# Patient Record
Sex: Female | Born: 1940 | Race: White | Hispanic: No
Health system: Midwestern US, Academic
[De-identification: ages and names within clinical notes are randomized; demographics above are authoritative.]

---

## 2018-12-21 ENCOUNTER — Encounter: Admit: 2018-12-21 | Discharge: 2018-12-22 | Payer: Medicare Other

## 2018-12-21 DIAGNOSIS — R69 Illness, unspecified: Secondary | ICD-10-CM

## 2019-01-11 ENCOUNTER — Encounter: Admit: 2019-01-11 | Discharge: 2019-01-11 | Payer: MEDICARE

## 2019-01-11 DIAGNOSIS — R69 Illness, unspecified: Principal | ICD-10-CM

## 2019-05-28 ENCOUNTER — Encounter: Admit: 2019-05-28 | Discharge: 2019-05-28

## 2019-05-28 DIAGNOSIS — M48061 Spinal stenosis, lumbar region without neurogenic claudication: Secondary | ICD-10-CM

## 2019-06-04 ENCOUNTER — Encounter: Admit: 2019-06-04 | Discharge: 2019-06-04

## 2019-06-04 NOTE — Telephone Encounter
Patient called asking if daughter would be allowed to come to clinic visit.  Informed patient we allow one visitor per patient during clinic visit and daughter would be able to come to appt.

## 2019-06-05 ENCOUNTER — Ambulatory Visit: Admit: 2019-06-05 | Discharge: 2019-06-05

## 2019-06-05 ENCOUNTER — Encounter: Admit: 2019-06-05 | Discharge: 2019-06-05

## 2019-06-05 DIAGNOSIS — F17200 Nicotine dependence, unspecified, uncomplicated: Secondary | ICD-10-CM

## 2019-06-05 DIAGNOSIS — R292 Abnormal reflex: Secondary | ICD-10-CM

## 2019-06-05 DIAGNOSIS — M48062 Spinal stenosis, lumbar region with neurogenic claudication: Secondary | ICD-10-CM

## 2019-06-05 DIAGNOSIS — R32 Unspecified urinary incontinence: Secondary | ICD-10-CM

## 2019-06-05 DIAGNOSIS — M48061 Spinal stenosis, lumbar region without neurogenic claudication: Principal | ICD-10-CM

## 2019-06-05 DIAGNOSIS — I739 Peripheral vascular disease, unspecified: Secondary | ICD-10-CM

## 2019-06-05 DIAGNOSIS — M5416 Radiculopathy, lumbar region: Secondary | ICD-10-CM

## 2019-06-05 DIAGNOSIS — Z8659 Personal history of other mental and behavioral disorders: Secondary | ICD-10-CM

## 2019-06-05 NOTE — Patient Instructions
It was a pleasure seeing you in clinic today.  You will be receiving a survey about today's clinic visit.  We encourage you to complete the survey.  Your opinion matters to us.  We need your feedback to help us continue to build on our strengths as well as look at areas for improvement.  Please don't hesitate to call if you have any questions.      Davaris Youtsey BSN, RN, CNOR  Clinical Nurse Coordinator  Dr. Brandon Carlson  [facility]  [facility]  [address]  [place], [place] [address]  [email or web address]  Phone: [phone]  Scheduling [phone]

## 2019-06-05 NOTE — Progress Notes
[facility]  Spinal Surgery Consultation      CHIEF COMPLAINT     Chief Complaint   Patient presents with   ??? Lower Back - Pain   ??? Left Leg - Pain   ??? Right Leg - Pain       HISTORY OF PRESENT ILLNESS      Stacy Mora is a 78 y.o. female.  She presents for evaluation of back, buttock, and bilateral leg pain, right worse than left.  Been going on for eight years.  Mostly in the low back and legs, right more than left.  She had an MRI performed which showed spinal stenosis and herniated disc degeneration.  She has this pain all the time and it gets worse with activity.  She describes her pain as aching, stabbing, tiring, burning, sharp, and unbearable.  Rest and bending over seem to help it.  Activity makes it worse.  She can stand or walk for about 5 minutes at a time.  She is able to walk further leaning over a shopping cart.  She gets pain that radiates down her legs to above her ankles, both sides.  No numbness or tingling.  No bladder dysfunction, but does endorse bowel incontinence.  No significant night pain.  Back pain rated as 4/10.  Leg pain 4/10.  No history of scoliosis.  She had x-ray and MRI performed at an outside institution.  She has tried physical therapy with no relief at outside institution.  She had chiropractic care done with some relief.  She had an injection performed which gave her relief for three days.  She has taken Ibuprofen which gives her some relief.  She smokes one pack per day and has done so for 60 years.      NSAIDS:  Yes  PT:  Yes  Pain medications:  Yes  Chiropractic:  Yes  Activity modification: Yes  Injections:  Yes    Previous Spine Surgery: No     PAST MEDICAL HISTORY     Medical History:   Diagnosis Date   ??? H/O: depression    ??? Incontinence    ??? Peripheral vascular disease, unspecified (HCC)     Peripheral vascular disease       PAST SURGICAL HISTORY     Surgical History:   Procedure Laterality Date   ??? HX BLADDER SUSPENSION ??? HX CARPAL TUNNEL RELEASE     ??? HX CHOLECYSTECTOMY     ??? HX KNEE SURGERY         FAMILY HISTORY   family history includes Cancer in her father; Diabetes in her mother; Heart problem in her mother.    SOCIAL HISTORY     Social History     Socioeconomic History   ??? Marital status: Widowed     Spouse name: Not on file   ??? Number of children: Not on file   ??? Years of education: Not on file   ??? Highest education level: Not on file   Occupational History   ??? Not on file   Tobacco Use   ??? Smoking status: Current Every Day Smoker     Packs/day: 1.00     Years: 50.00     Pack years: 50.00     Types: Cigarettes   ??? Smokeless tobacco: Never Used   Substance and Sexual Activity   ??? Alcohol use: Yes     Alcohol/week: 2.5 standard drinks     Types: 3 Standard  drinks or equivalent per week     Frequency: 4 or more times a week   ??? Drug use: No   ??? Sexual activity: Not on file   Other Topics Concern   ??? Not on file   Social History Narrative   ??? Not on file       ALLERGIES     Allergies   Allergen Reactions   ??? Levofloxacin      Allergy recorded in SMS: LEVAQUIN~Reactions: HIVES/STIFFNESS   ??? Penicillins      Allergy recorded in SMS: PCN~Reactions: HIVES~HIVES/INJ.SITE       MEDICATIONS     Current Outpatient Medications:   ???  buPROPion XL (WELLBUTRIN XL) 150 mg tablet, Take 150 mg by mouth every morning. Do not crush or chew., Disp: , Rfl:   ???  citalopram (CELEXA) 40 mg tablet, Take 40 mg by mouth daily., Disp: , Rfl:   ???  levothyroxine (SYNTHROID) 125 mcg tablet, Take 125 mcg by mouth daily 30 minutes before breakfast., Disp: , Rfl:   ???  LEXAPRO PO, Take by mouth. , Disp: , Rfl:   ???  losartan (COZAAR) 50 mg tablet, Take 50 mg by mouth daily., Disp: , Rfl:   ???  rosuvastatin (CRESTOR) 10 mg tablet, Take 10 mg by mouth daily., Disp: , Rfl:   ???  SYNTHROID PO, Take by mouth. , Disp: , Rfl:   ???  VYTORIN 10/10 10-10 mg Tab, Take 1 Tab by mouth At Bedtime Daily. , Disp: , Rfl:     REVIEW OF SYSTEMS   Review of Systems Constitutional: Positive for activity change.   HENT: Positive for hearing loss.    Respiratory: Positive for cough.    Gastrointestinal: Positive for rectal pain.   Musculoskeletal: Positive for back pain.   Skin: Positive for color change.   All other systems reviewed and are negative.    A 10-point ROS was otherwise negative.  PHYSICAL EXAM   Blood pressure 122/53, pulse 64, resp. rate 18, height 160 cm (63), weight 81.6 kg (180 lb), SpO2 100 %.  Body mass index is 31.89 kg/m???.       Pain Score: Three    Constitutional: Alert, NAD  Psychiatric: Mood and affect appropriate  Eyes: EOMI  Respiratory: Unlabored respirations  Cardiovascular: Palpable radial and pedal pulses distally.  Skin: No rashes or lesions  Musculoskeletal:  Spine Exam:    Gait: Ambulates with a normal gait. Able to heel and toe walk without difficulty.   Stance: Balanced in the coronal and sagittal planes.     BACK:   No scars, rash or lesions.    PALPATION:  No pain in the midline or paraspinals. No pain at the SI joint or sciatic notch.    MOTOR:  Lower Ext. Iliopsoas Quads Hamstrings Gastroc Tib ant EHL   Right 5 5 5 5 5 5    Left 5 5 5 5 5 5      SENSATION:  Lower extremity: Sensation intact to light touch in L3-S1 distributions    REFLEXES:   Patellar Achilles   Right 2+ 2+   Left 2+ 2+     -Negative straight leg raise bilaterally  -Babinski's plantar bilaterally  -No clonus  -Positive Hoffman's bilaterally    RADIOGRAPHIC EVALUATION     AP lateral scoliosis films from today are available for review.  Multi-level degenerative disc disease.  Balanced in the coronal and sagittal planes.  Multiple levels of disc degeneration cervical spine, as well as  the lumbar spine.  She has profound calcifications of the lumbar vasculature.  There is evidence of previous compression deformity at L1, as well as L2.  There is polymethylmethacrylate cement in the L1 vertebral body.      MRI of the L spine from 12/31/2018 is also available for review. Multi-level degenerative disc disease.  Again noted L1 compression fracture.  Axials demonstrate moderate central stenosis at L1-2.  Severe central stenosis and lateral recess stenosis L2-3.  Severe central and lateral recess stenosis L3-4.  Severe central and lateral recess stenosis L4-5.  No significant stenosis L5-S1.         ASSESSMENT / PLAN     Stacy Mora is a 78 y.o. female with:    1. Spinal stenosis of lumbar region, unspecified whether neurogenic claudication present  MRI C-SPINE WO CONTRAST   2. Lumbar radicular pain  MRI C-SPINE WO CONTRAST   3. Neurogenic claudication  MRI C-SPINE WO CONTRAST   4. Tobacco use disorder  MRI C-SPINE WO CONTRAST   5. Hoffman's reflex positive  MRI C-SPINE WO CONTRAST       I reviewed the patient's imaging findings with her and her daughter.  She has pathology that would correlate with her symptoms, specifically spinal stenosis at L4-5, L3-4, L2-3, and L1-2.  I think we could improve her pain with surgery.  I have discussed with her that she is not a surgical candidate at this point in time given her smoking.  She would need to be completely nicotine free for 30 days prior to scheduling surgery, if she wished to pursue surgery.  I think that based on her imaging studies I don't see any evidence of instability or any indications for a fusion.  My recommendation would be a laminectomy and decompression procedure from L1-5.  With her incontinence and positive Hoffman's bilaterally, I would like to send her for an MRI of her cervical spine.  We will review this once completed.          In the presence of Greer Pickerel, MD , I have taken down these notes, Mamie Laurel, Scribe. June 05, 2019 3:59 PM      Dwyane Luo. Lisette Grinder, MD, MPH  Spinal Surgery  [place]. [facility]  Nurse: Laverle Patter, BSN, RN, CNOR   [phone]  -  [email or web address]

## 2019-06-13 ENCOUNTER — Encounter: Admit: 2019-06-13 | Discharge: 2019-06-14

## 2019-06-15 ENCOUNTER — Encounter: Admit: 2019-06-15 | Discharge: 2019-06-15

## 2019-07-16 ENCOUNTER — Encounter: Admit: 2019-07-16 | Discharge: 2019-07-16

## 2019-07-16 DIAGNOSIS — R292 Abnormal reflex: Secondary | ICD-10-CM

## 2019-07-16 DIAGNOSIS — M5416 Radiculopathy, lumbar region: Secondary | ICD-10-CM

## 2019-07-16 DIAGNOSIS — M48062 Spinal stenosis, lumbar region with neurogenic claudication: Secondary | ICD-10-CM

## 2019-07-16 DIAGNOSIS — M48061 Spinal stenosis, lumbar region without neurogenic claudication: Secondary | ICD-10-CM

## 2019-07-16 DIAGNOSIS — F17200 Nicotine dependence, unspecified, uncomplicated: Secondary | ICD-10-CM

## 2020-02-11 ENCOUNTER — Encounter: Admit: 2020-02-11 | Discharge: 2020-02-11 | Payer: MEDICARE

## 2020-02-11 NOTE — Telephone Encounter
Patient lvm asking to set up appt with Dr. Lisette Grinder. Returned call.  Patient states she saw Dr. Lisette Grinder a year ago and was told she needed to quit smoking.  She hasn't yet but is going to start now.  Back pain in getting worse.  Appt made.  Patient agrees to appt date and time.

## 2020-02-26 ENCOUNTER — Encounter: Admit: 2020-02-26 | Discharge: 2020-02-26 | Payer: MEDICARE

## 2020-02-26 NOTE — Telephone Encounter
Patient Stacy Mora stating she needs to cancel appt on 3/30 with Dr. Lisette Grinder.  Returned call.  Patient states she hasn't stopped smoking.  Requested appt be moved to first part of May.  Appt rescheduled as requested.

## 2021-07-09 ENCOUNTER — Encounter: Admit: 2021-07-09 | Discharge: 2021-07-09 | Payer: MEDICARE

## 2021-07-15 ENCOUNTER — Encounter: Admit: 2021-07-15 | Discharge: 2021-07-15 | Payer: MEDICARE

## 2021-07-15 NOTE — Telephone Encounter
07/15/21 - Records have been requested per work que task / sjg    Records that came with the referral have been scanned into the patient's chart via [employer] / sjg  _________________________________    Please request additional records from patient's PCP Dr. Ozzie Hoyle, Ph - [phone],   Fax - [phone]    No external cardiologist - Cardiac care done with PCP

## 2021-07-16 ENCOUNTER — Encounter: Admit: 2021-07-16 | Discharge: 2021-07-16 | Payer: MEDICARE

## 2021-07-17 ENCOUNTER — Encounter: Admit: 2021-07-17 | Discharge: 2021-07-17 | Payer: MEDICARE

## 2021-07-20 ENCOUNTER — Encounter: Admit: 2021-07-20 | Discharge: 2021-07-20 | Payer: MEDICARE

## 2021-07-20 DIAGNOSIS — R32 Unspecified urinary incontinence: Secondary | ICD-10-CM

## 2021-07-20 DIAGNOSIS — I739 Peripheral vascular disease, unspecified: Secondary | ICD-10-CM

## 2021-07-20 DIAGNOSIS — Z8659 Personal history of other mental and behavioral disorders: Secondary | ICD-10-CM

## 2021-07-20 NOTE — Progress Notes
Records Request    Medical records request for continuation of care:    Patient has appointment on 07/28/2021   with  Dr. Danella Maiers* .    Please fax records to [facility] [place] [facility] [phone]    Request records: STAT          Cardiac Office Note (Most recent)    EKG's            Any Cardiac Testing (2012-2022)    Any cardiac-related records (2012-2022)    Lab- Most recent Lipid Panel              Thank you,      Cardiovascular Medicine  [facility]  [address]  [place], [place] [address]  Phone:  [phone]  Fax:  [phone]

## 2021-07-21 ENCOUNTER — Encounter: Admit: 2021-07-21 | Discharge: 2021-07-21 | Payer: MEDICARE

## 2021-07-28 ENCOUNTER — Encounter: Admit: 2021-07-28 | Discharge: 2021-07-28 | Payer: MEDICARE

## 2021-07-28 DIAGNOSIS — I272 Pulmonary hypertension, unspecified: Secondary | ICD-10-CM

## 2021-07-28 DIAGNOSIS — R32 Unspecified urinary incontinence: Secondary | ICD-10-CM

## 2021-07-28 DIAGNOSIS — E7800 Pure hypercholesterolemia, unspecified: Secondary | ICD-10-CM

## 2021-07-28 DIAGNOSIS — I739 Peripheral vascular disease, unspecified: Secondary | ICD-10-CM

## 2021-07-28 DIAGNOSIS — Z136 Encounter for screening for cardiovascular disorders: Secondary | ICD-10-CM

## 2021-07-28 DIAGNOSIS — J410 Simple chronic bronchitis: Secondary | ICD-10-CM

## 2021-07-28 DIAGNOSIS — I5032 Chronic diastolic (congestive) heart failure: Secondary | ICD-10-CM

## 2021-07-28 DIAGNOSIS — Z8659 Personal history of other mental and behavioral disorders: Secondary | ICD-10-CM

## 2021-07-28 DIAGNOSIS — I10 Essential (primary) hypertension: Secondary | ICD-10-CM

## 2021-07-28 NOTE — Assessment & Plan Note
Her PA systolic was estimated in the TIR-44'R on her echocardiogram so this is really fairly mild and probably related to primary lung disease.

## 2021-07-28 NOTE — Assessment & Plan Note
Lab Results   Component Value Date    CHOL 167 03/21/2019    TRIG 68 03/21/2019    HDL 54 03/21/2019    LDL 99 03/21/2019    VLDL 14 03/21/2019    CHOLHDLC 3 03/21/2019

## 2021-07-28 NOTE — Assessment & Plan Note
Home BP averages 120/75.

## 2021-07-28 NOTE — Progress Notes
Date of Service: 07/28/2021    Stacy Mora is a 80 y.o. female.       HPI     Stacy Mora was in the [facility] clinic today for consultation.  She is an 80 year old lady who developed problems with peripheral edema earlier this summer.  She has been a patient of Dr. Hal Hope for a number of years and she has had fairly extensive testing over the summer months.  She had an echocardiogram as well as a stress test.  She appears to have low probability for significant coronary disease but she does have diastolic dysfunction with mild pulmonary hypertension.  She smokes about a half a pack to a pack of cigarettes per day and has not had pulmonary function testing for a number of years.    She is not having any angina symptoms but she does have increased exertional breathlessness.  Her peripheral edema has improved somewhat.  She admits that she eats a lot of salt.  She has longstanding problems with urinary incontinence and she has refused to use a diuretic medication.  She is pretty active and she does not have time to keep her legs elevated nor is she interested in using compression stockings during the hot summer months.    She denies any palpitations and she has had no TIA or stroke symptoms.         Vitals:    07/28/21 1023 07/28/21 1038   BP: (!) 158/82 (!) 152/78   BP Source: Arm, Left Upper Arm, Right Upper   Pulse: 76    SpO2: 100%    O2 Percent: 100 %    O2 Device: None (Room air)    PainSc: Zero    Weight: 83.1 kg (183 lb 3.2 oz)    Height: 160 cm (5' 3)      Body mass index is 32.45 kg/m?Marland Kitchen     Past Medical History  Patient Active Problem List    Diagnosis Date Noted   ? 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 Hyperlipidemia 07/21/2021   ? Pulmonary hypertension (HCC) 07/21/2021   ? 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 Congestive heart failure (CHF) (HCC) 07/21/2021   ? Edema 07/21/2021     - 06/09/21- [facility]- Regadenoson MPI- Nondiagnostic pharmacologic stress EKG. Normal LV systolic function. Normal perfusion without evidence of significant ischemia. Resting BP was 228/26mmHg.   -06/09/21- [facility]- Echo- Normal LV size and systolic function. LVEF 72%. Mild mitral and tricuspid valve regurgitation. Right ventricular systolic pressure consistent with mild pulmonary hyperptension. No significant pericardial effusion.     ? COPD (chronic obstructive pulmonary disease) (HCC) 05/09/2020   ? Hypertension 05/09/2020   ? 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 Obesity (BMI 30-39.9) 05/09/2020         Review of Systems   Constitutional: Positive for weight gain.   HENT: Positive for hearing loss and tinnitus.    Eyes: Negative.    Cardiovascular: Positive for claudication, dyspnea on exertion and leg swelling.   Respiratory: Positive for cough, shortness of breath and sputum production.    Endocrine: Negative.    Hematologic/Lymphatic: Negative.    Skin: Negative.    Musculoskeletal: Positive for falls.   Gastrointestinal: Positive for bloating.   Genitourinary: Positive for bladder incontinence and incomplete emptying.   Neurological: Positive for excessive daytime sleepiness, light-headedness and loss of balance.   Psychiatric/Behavioral: Positive for depression.   Allergic/Immunologic: Negative.        Physical Exam    Physical  Exam   General Appearance: no distress   Skin: warm, no ulcers or xanthomas   Digits and Nails: no cyanosis or clubbing   Eyes: conjunctivae and lids normal, pupils are equal and round   Teeth/Gums/Palate: dentition unremarkable, no lesions   Lips & Oral Mucosa: no pallor or cyanosis   Neck Veins: normal JVP , neck veins are not distended   Thyroid: no nodules, masses, tenderness or enlargement   Chest Inspection: chest is normal in appearance   Respiratory Effort: breathing comfortably, no respiratory distress   Auscultation/Percussion: diffuse mild rhonchi and wheezing  PMI: PMI not enlarged or displaced   Cardiac Rhythm: regular rhythm and normal rate   Cardiac Auscultation: S1, S2 normal, no rub, no gallop   Murmurs: no murmur   Peripheral Circulation: normal peripheral circulation   Carotid Arteries: normal carotid upstroke bilaterally, no bruits   Radial Arteries: normal symmetric radial pulses   Abdominal Aorta: no abdominal aortic bruit   Pedal Pulses: normal symmetric pedal pulses   Lower Extremity Edema: 1+ bilateral lower extremity edema   Abdominal Exam: soft, non-tender, no masses, bowel sounds normal   Liver & Spleen: no organomegaly   Gait & Station: walks without assistance   Muscle Strength: normal muscle tone   Orientation: oriented to time, place and person   Affect & Mood: appropriate and sustained affect   Language and Memory: patient responsive and seems to comprehend information   Neurologic Exam: neurological assessment grossly intact   Other: moves all extremities      Cardiovascular Studies    EKG:  SR, rate 60.  Normal EKG    Cardiovascular Health Factors  Vitals BP Readings from Last 3 Encounters:   07/28/21 (!) 152/78   06/05/19 122/53     Wt Readings from Last 3 Encounters:   07/28/21 83.1 kg (183 lb 3.2 oz)   06/05/19 81.6 kg (180 lb)     BMI Readings from Last 3 Encounters:   07/28/21 32.45 kg/m?   06/05/19 31.89 kg/m?      Smoking Social History     Tobacco Use   Smoking Status Current Every Day Smoker   ? Packs/day: 1.50   ? Years: 50.00   ? Pack years: 75.00   ? Types: Cigarettes   Smokeless Tobacco Never Used      Lipid Profile Cholesterol   Date Value Ref Range Status   03/21/2019 167  Final     HDL   Date Value Ref Range Status   03/21/2019 54  Final     LDL   Date Value Ref Range Status   03/21/2019 99  Final     Triglycerides   Date Value Ref Range Status   03/21/2019 68  Final      Blood Sugar No results found for: HGBA1C  Glucose   Date Value Ref Range Status   06/03/2021 86     08/24/2005 113 (H) 70 - 110 MG/DL Final   16/09/9603 540 70 - 110 MG/DL Final   98/10/9146 89 70 - 110 MG/DL Final          Problems Addressed Today  Encounter Diagnoses   Name Primary?   ? Screening for heart disease Yes   ? Chronic diastolic congestive heart failure (HCC)    ? Primary hypertension    ? Pulmonary hypertension (HCC)    ? Pure hypercholesterolemia    ? Simple chronic bronchitis (HCC)        Assessment and Plan  Hypertension  Home BP averages 120/75.    Pulmonary hypertension (HCC)  Her PA systolic was estimated in the ZOX-09'U on her echocardiogram so this is really fairly mild and probably related to primary lung disease.    Hyperlipidemia  Lab Results   Component Value Date    CHOL 167 03/21/2019    TRIG 68 03/21/2019    HDL 54 03/21/2019    LDL 99 03/21/2019    VLDL 14 03/21/2019    CHOLHDLC 3 03/21/2019          Congestive heart failure (CHF) (HCC)  She has some degree of diastolic heart failure involving both the right and left ventricles.  Ordinarily I would suggest the use of a diuretic but she is pretty adamant about not considering this.  She might consider compression stockings during the colder months of the year.  She will look into the possibility of exercise at the swimming pool at the [facility].  She probably want alter her sodium intake much.    Simple chronic bronchitis (HCC)  Her pulmonary exam today did not sound very good and I suspect that some if not most of her exertional dyspnea is related to intrinsic lung disease.  We talked about the possibility of smoking cessation and she is simply not interested.      Current Medications (including today's revisions)  ? acetaminophen (TYLENOL EXTRA STRENGTH) 500 mg tablet Take 500 mg by mouth as Needed for Pain. Max of 4,000 mg of acetaminophen in 24 hours.   ? BIOTIN PO Take 1 tablet by mouth daily.   ? buPROPion XL (WELLBUTRIN XL) 150 mg tablet Take 150 mg by mouth every morning. Do not crush or chew.   ? citalopram (CELEXA) 40 mg tablet Take 40 mg by mouth daily.   ? docusate (COLACE) 100 mg capsule Take 100 mg by mouth as Needed.   ? levothyroxine (SYNTHROID) 150 mcg tablet Take 1 tablet by mouth daily.   ? loratadine (CLARITIN) 10 mg tablet Take 10 mg by mouth daily.   ? losartan-hydrochlorothiazide (HYZAAR) 100-12.5 mg tablet Take 1 tablet by mouth daily.   ? oxybutynin XL (DITROPAN XL) 5 mg tablet Take 1 tablet by mouth daily.   ? rosuvastatin (CRESTOR) 10 mg tablet Take 10 mg by mouth daily.     Total time spent on today's office visit was 45 minutes.  This includes face-to-face in person visit with patient as well as nonface-to-face time including review of the EMR, outside records, labs, radiologic studies, echocardiogram & other cardiovascular studies, formation of treatment plan, after visit summary, future disposition, and lastly on documentation.

## 2021-07-28 NOTE — Assessment & Plan Note
She has some degree of diastolic heart failure involving both the right and left ventricles.  Ordinarily I would suggest the use of a diuretic but she is pretty adamant about not considering this.  She might consider compression stockings during the colder months of the year.  She will look into the possibility of exercise at the swimming pool at the [facility].  She probably want alter her sodium intake much.

## 2021-07-28 NOTE — Assessment & Plan Note
Her pulmonary exam today did not sound very good and I suspect that some if not most of her exertional dyspnea is related to intrinsic lung disease.  We talked about the possibility of smoking cessation and she is simply not interested.

## 2023-07-12 ENCOUNTER — Ambulatory Visit: Admit: 2023-07-12 | Discharge: 2023-07-12 | Payer: MEDICARE

## 2023-07-12 ENCOUNTER — Encounter: Admit: 2023-07-12 | Discharge: 2023-07-12 | Payer: MEDICARE

## 2023-07-12 DIAGNOSIS — I10 Essential (primary) hypertension: Secondary | ICD-10-CM

## 2023-07-22 IMAGING — CR HIPCMLT
4 series · 4 of 4 positions shown · non-contrast
Comparison: none

[hip ap pelvis]
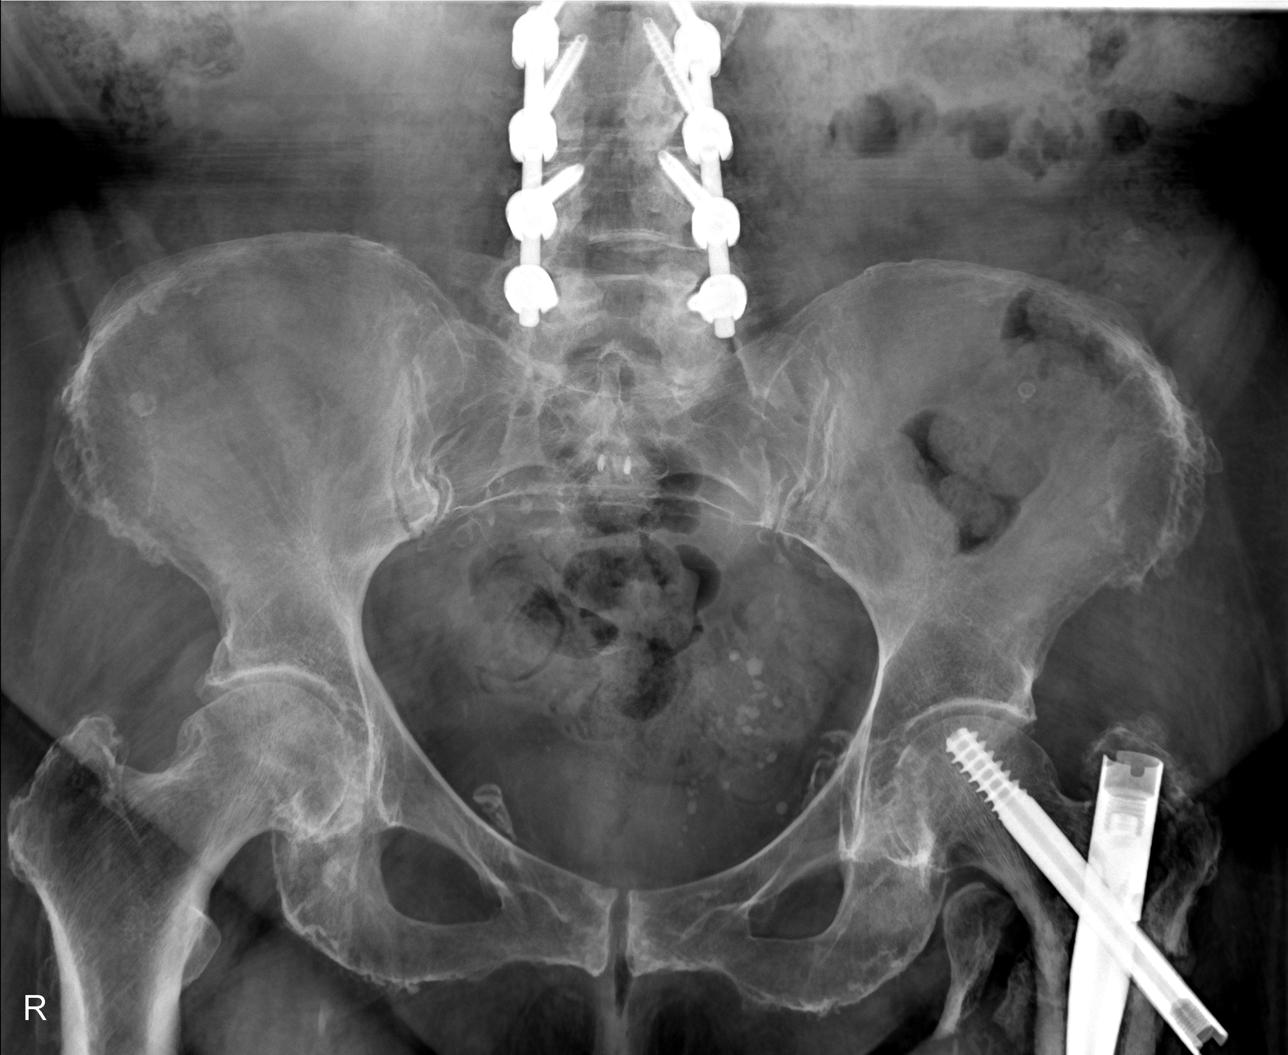

[hip ap (1 of 2)]
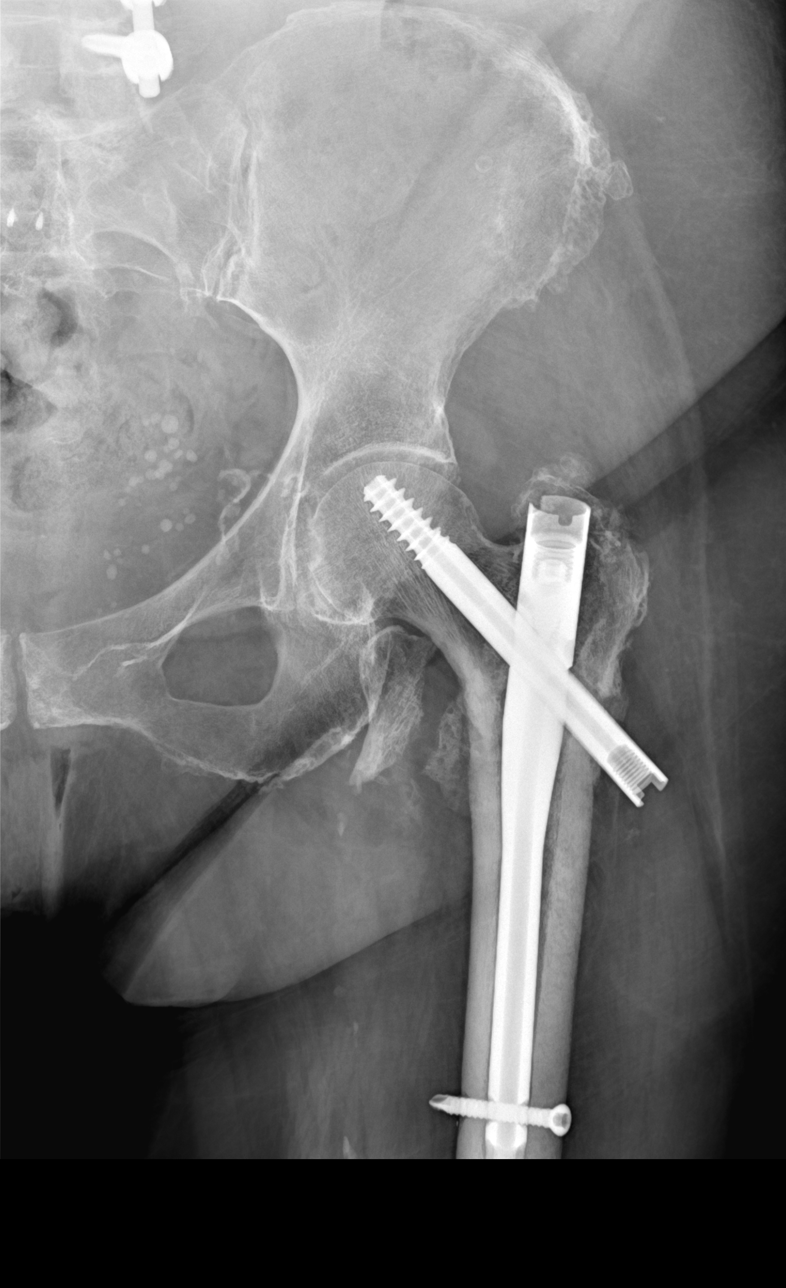

[hip ap (2 of 2)]
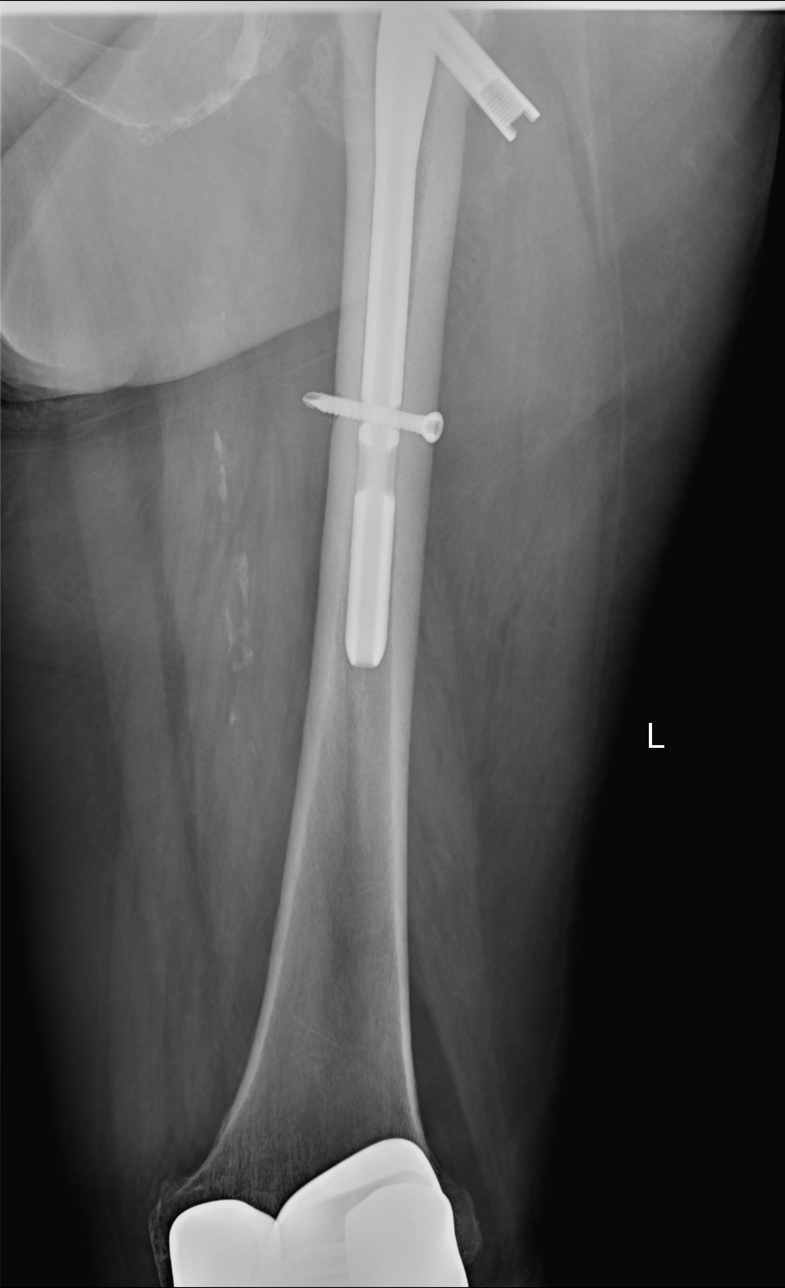

[hip frog lat]
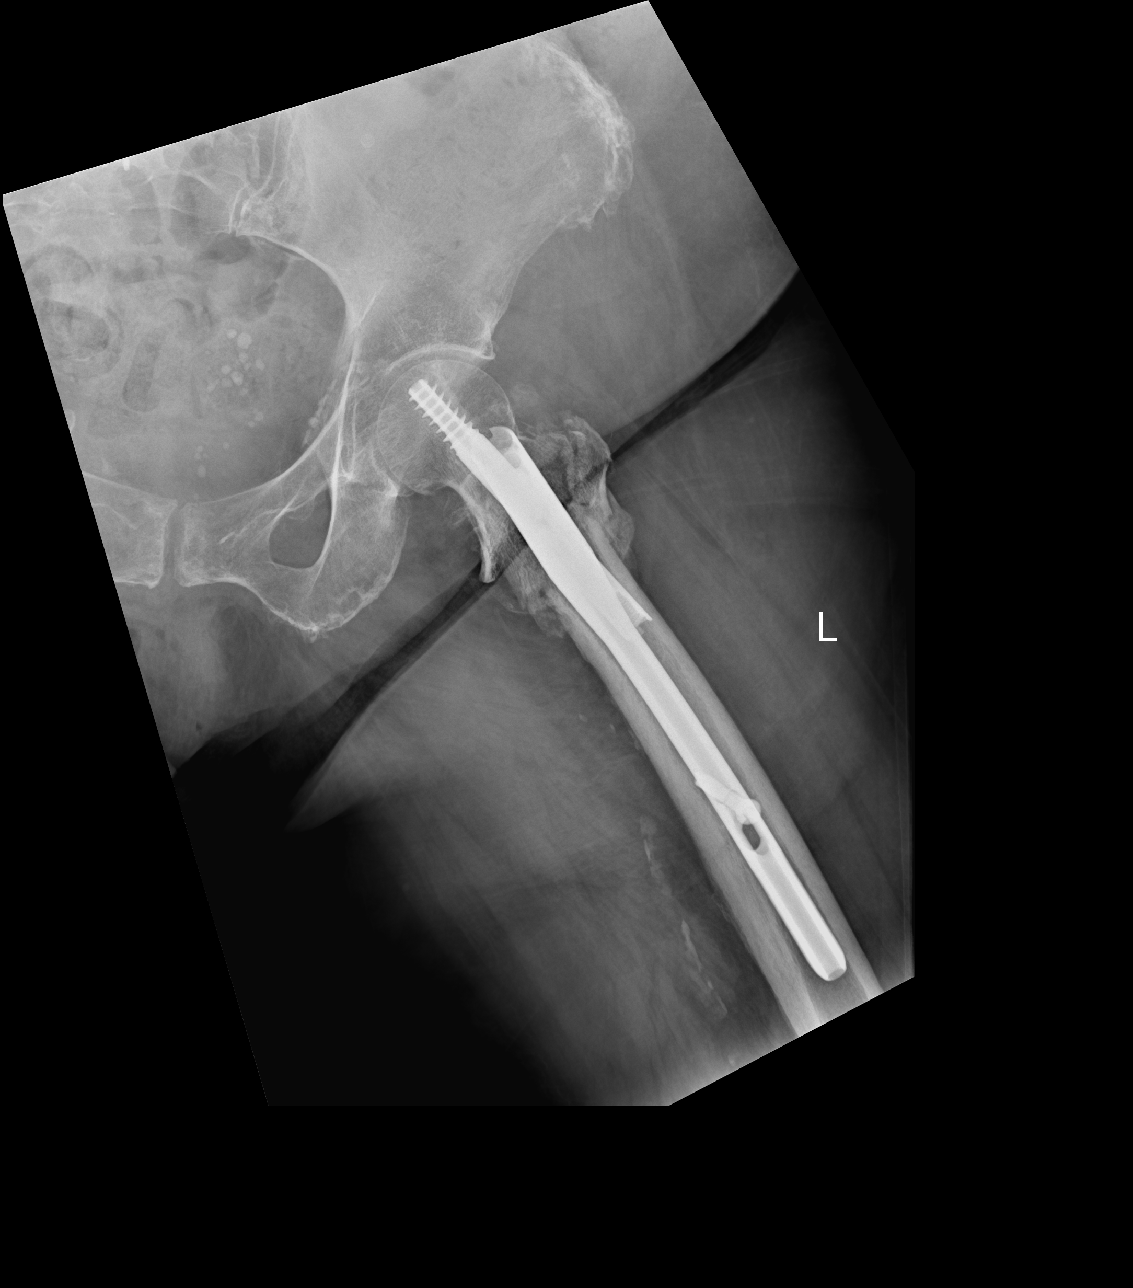

[4 of 4 positions shown; findings below may reference images not displayed]

DIAGNOSTIC STUDIES

EXAM

XR hip LT, 2-3V w or wo pelvis

INDICATION

fx f/u
F/U HIP FX W/ORIF.   HB

TECHNIQUE

AP pelvis AP and lateral views left hip

COMPARISONS

November 19, 2021

FINDINGS

Postop changes lumbar spine are partially imaged. Patient is status post ORIF of left hip fracture
with intramedullary rod in place and medially displaced lesser trochanteric fracture. The appearance
is identical to prior exam. No new fractures are seen. Calcification of the surrounding vessels is
evident.

IMPRESSION

No significant change in appearance of the left hip from prior exam.

Tech Notes:

F/U HIP FX W/ORIF.
HB
